# Patient Record
Sex: Female | Born: 2000
Health system: Southern US, Community
[De-identification: ages and names within clinical notes are randomized; demographics above are authoritative.]

## PROBLEM LIST (undated history)

## (undated) DIAGNOSIS — J45990 Exercise induced bronchospasm: Secondary | ICD-10-CM

## (undated) DIAGNOSIS — F909 Attention-deficit hyperactivity disorder, unspecified type: Secondary | ICD-10-CM

## (undated) DIAGNOSIS — T8859XA Other complications of anesthesia, initial encounter: Secondary | ICD-10-CM

## (undated) DIAGNOSIS — F419 Anxiety disorder, unspecified: Secondary | ICD-10-CM

## (undated) DIAGNOSIS — K219 Gastro-esophageal reflux disease without esophagitis: Secondary | ICD-10-CM

## (undated) HISTORY — DX: Attention-deficit hyperactivity disorder, unspecified type: F90.9

## (undated) HISTORY — PX: TONSILLECTOMY AND ADENOIDECTOMY: SHX28

## (undated) HISTORY — DX: Gastro-esophageal reflux disease without esophagitis: K21.9

## (undated) HISTORY — PX: FRACTURE SURGERY: SHX138

## (undated) HISTORY — PX: TYMPANOSTOMY: SHX2586

## (undated) HISTORY — DX: Exercise induced bronchospasm: J45.990

---
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
## 2019-02-09 DIAGNOSIS — Z6821 Body mass index (BMI) 21.0-21.9, adult: Secondary | ICD-10-CM | POA: Diagnosis not present

## 2019-02-09 DIAGNOSIS — Z01419 Encounter for gynecological examination (general) (routine) without abnormal findings: Secondary | ICD-10-CM | POA: Diagnosis not present

## 2019-02-09 DIAGNOSIS — Z113 Encounter for screening for infections with a predominantly sexual mode of transmission: Secondary | ICD-10-CM | POA: Diagnosis not present

## 2019-02-16 DIAGNOSIS — H5213 Myopia, bilateral: Secondary | ICD-10-CM | POA: Diagnosis not present

## 2019-02-18 DIAGNOSIS — Z03818 Encounter for observation for suspected exposure to other biological agents ruled out: Secondary | ICD-10-CM | POA: Diagnosis not present

## 2019-02-18 DIAGNOSIS — Z20828 Contact with and (suspected) exposure to other viral communicable diseases: Secondary | ICD-10-CM | POA: Diagnosis not present

## 2019-02-18 DIAGNOSIS — Z30430 Encounter for insertion of intrauterine contraceptive device: Secondary | ICD-10-CM | POA: Diagnosis not present

## 2019-02-18 DIAGNOSIS — Z3202 Encounter for pregnancy test, result negative: Secondary | ICD-10-CM | POA: Diagnosis not present

## 2019-02-19 ENCOUNTER — Ambulatory Visit: Payer: BLUE CROSS/BLUE SHIELD | Attending: Internal Medicine

## 2019-02-19 DIAGNOSIS — Z20822 Contact with and (suspected) exposure to covid-19: Secondary | ICD-10-CM | POA: Diagnosis not present

## 2019-02-20 LAB — NOVEL CORONAVIRUS, NAA: SARS-CoV-2, NAA: NOT DETECTED

## 2019-03-01 DIAGNOSIS — Z3202 Encounter for pregnancy test, result negative: Secondary | ICD-10-CM | POA: Diagnosis not present

## 2019-03-01 DIAGNOSIS — Z30431 Encounter for routine checking of intrauterine contraceptive device: Secondary | ICD-10-CM | POA: Diagnosis not present

## 2019-03-01 DIAGNOSIS — R102 Pelvic and perineal pain: Secondary | ICD-10-CM | POA: Diagnosis not present

## 2019-03-02 DIAGNOSIS — R102 Pelvic and perineal pain: Secondary | ICD-10-CM | POA: Diagnosis not present

## 2019-03-06 DIAGNOSIS — Z20828 Contact with and (suspected) exposure to other viral communicable diseases: Secondary | ICD-10-CM | POA: Diagnosis not present

## 2019-03-17 DIAGNOSIS — Z20828 Contact with and (suspected) exposure to other viral communicable diseases: Secondary | ICD-10-CM | POA: Diagnosis not present

## 2019-03-26 DIAGNOSIS — Z7189 Other specified counseling: Secondary | ICD-10-CM | POA: Diagnosis not present

## 2019-03-26 DIAGNOSIS — Z20828 Contact with and (suspected) exposure to other viral communicable diseases: Secondary | ICD-10-CM | POA: Diagnosis not present

## 2019-03-31 DIAGNOSIS — Z20828 Contact with and (suspected) exposure to other viral communicable diseases: Secondary | ICD-10-CM | POA: Diagnosis not present

## 2019-04-02 DIAGNOSIS — Z30431 Encounter for routine checking of intrauterine contraceptive device: Secondary | ICD-10-CM | POA: Diagnosis not present

## 2019-04-02 DIAGNOSIS — H5210 Myopia, unspecified eye: Secondary | ICD-10-CM | POA: Diagnosis not present

## 2019-04-14 DIAGNOSIS — Z20828 Contact with and (suspected) exposure to other viral communicable diseases: Secondary | ICD-10-CM | POA: Diagnosis not present

## 2019-04-21 DIAGNOSIS — Z20828 Contact with and (suspected) exposure to other viral communicable diseases: Secondary | ICD-10-CM | POA: Diagnosis not present

## 2019-04-22 DIAGNOSIS — Z23 Encounter for immunization: Secondary | ICD-10-CM | POA: Diagnosis not present

## 2019-04-24 DIAGNOSIS — Z20828 Contact with and (suspected) exposure to other viral communicable diseases: Secondary | ICD-10-CM | POA: Diagnosis not present

## 2019-04-28 DIAGNOSIS — Z20828 Contact with and (suspected) exposure to other viral communicable diseases: Secondary | ICD-10-CM | POA: Diagnosis not present

## 2019-07-22 DIAGNOSIS — S32009A Unspecified fracture of unspecified lumbar vertebra, initial encounter for closed fracture: Secondary | ICD-10-CM

## 2019-07-22 HISTORY — DX: Unspecified fracture of unspecified lumbar vertebra, initial encounter for closed fracture: S32.009A

## 2019-07-24 DIAGNOSIS — R52 Pain, unspecified: Secondary | ICD-10-CM | POA: Diagnosis not present

## 2019-07-24 DIAGNOSIS — S3993XA Unspecified injury of pelvis, initial encounter: Secondary | ICD-10-CM | POA: Diagnosis not present

## 2019-07-24 DIAGNOSIS — S6992XA Unspecified injury of left wrist, hand and finger(s), initial encounter: Secondary | ICD-10-CM | POA: Diagnosis not present

## 2019-07-24 DIAGNOSIS — R279 Unspecified lack of coordination: Secondary | ICD-10-CM | POA: Diagnosis not present

## 2019-07-24 DIAGNOSIS — Z743 Need for continuous supervision: Secondary | ICD-10-CM | POA: Diagnosis not present

## 2019-07-24 DIAGNOSIS — S52032C Displaced fracture of olecranon process with intraarticular extension of left ulna, initial encounter for open fracture type IIIA, IIIB, or IIIC: Secondary | ICD-10-CM | POA: Diagnosis not present

## 2019-07-24 DIAGNOSIS — Y9289 Other specified places as the place of occurrence of the external cause: Secondary | ICD-10-CM | POA: Diagnosis not present

## 2019-07-24 DIAGNOSIS — R58 Hemorrhage, not elsewhere classified: Secondary | ICD-10-CM | POA: Diagnosis not present

## 2019-07-24 DIAGNOSIS — S52022A Displaced fracture of olecranon process without intraarticular extension of left ulna, initial encounter for closed fracture: Secondary | ICD-10-CM | POA: Diagnosis not present

## 2019-07-24 DIAGNOSIS — S52032B Displaced fracture of olecranon process with intraarticular extension of left ulna, initial encounter for open fracture type I or II: Secondary | ICD-10-CM | POA: Diagnosis not present

## 2019-07-24 DIAGNOSIS — S32058A Other fracture of fifth lumbar vertebra, initial encounter for closed fracture: Secondary | ICD-10-CM | POA: Diagnosis not present

## 2019-07-24 DIAGNOSIS — S0993XA Unspecified injury of face, initial encounter: Secondary | ICD-10-CM | POA: Diagnosis not present

## 2019-07-24 DIAGNOSIS — Y998 Other external cause status: Secondary | ICD-10-CM | POA: Diagnosis not present

## 2019-07-24 DIAGNOSIS — S299XXA Unspecified injury of thorax, initial encounter: Secondary | ICD-10-CM | POA: Diagnosis not present

## 2019-07-24 DIAGNOSIS — S3991XA Unspecified injury of abdomen, initial encounter: Secondary | ICD-10-CM | POA: Diagnosis not present

## 2019-07-24 DIAGNOSIS — S3289XA Fracture of other parts of pelvis, initial encounter for closed fracture: Secondary | ICD-10-CM | POA: Diagnosis not present

## 2019-07-24 DIAGNOSIS — R5381 Other malaise: Secondary | ICD-10-CM | POA: Diagnosis not present

## 2019-07-24 DIAGNOSIS — M25522 Pain in left elbow: Secondary | ICD-10-CM | POA: Diagnosis not present

## 2019-07-24 DIAGNOSIS — S52002A Unspecified fracture of upper end of left ulna, initial encounter for closed fracture: Secondary | ICD-10-CM | POA: Diagnosis not present

## 2019-07-24 DIAGNOSIS — S32059A Unspecified fracture of fifth lumbar vertebra, initial encounter for closed fracture: Secondary | ICD-10-CM | POA: Diagnosis not present

## 2019-07-24 DIAGNOSIS — G8911 Acute pain due to trauma: Secondary | ICD-10-CM | POA: Diagnosis not present

## 2019-07-24 DIAGNOSIS — W091XXA Fall from playground swing, initial encounter: Secondary | ICD-10-CM | POA: Diagnosis not present

## 2019-07-24 DIAGNOSIS — S199XXA Unspecified injury of neck, initial encounter: Secondary | ICD-10-CM | POA: Diagnosis not present

## 2019-07-24 DIAGNOSIS — S52022B Displaced fracture of olecranon process without intraarticular extension of left ulna, initial encounter for open fracture type I or II: Secondary | ICD-10-CM | POA: Diagnosis not present

## 2019-07-24 DIAGNOSIS — S51002A Unspecified open wound of left elbow, initial encounter: Secondary | ICD-10-CM | POA: Diagnosis not present

## 2019-07-24 DIAGNOSIS — S4992XA Unspecified injury of left shoulder and upper arm, initial encounter: Secondary | ICD-10-CM | POA: Diagnosis not present

## 2019-07-24 DIAGNOSIS — S59912A Unspecified injury of left forearm, initial encounter: Secondary | ICD-10-CM | POA: Diagnosis not present

## 2019-07-24 DIAGNOSIS — S42402B Unspecified fracture of lower end of left humerus, initial encounter for open fracture: Secondary | ICD-10-CM | POA: Diagnosis not present

## 2019-07-24 DIAGNOSIS — W0110XA Fall on same level from slipping, tripping and stumbling with subsequent striking against unspecified object, initial encounter: Secondary | ICD-10-CM | POA: Diagnosis not present

## 2019-07-24 DIAGNOSIS — W19XXXA Unspecified fall, initial encounter: Secondary | ICD-10-CM | POA: Diagnosis not present

## 2019-07-24 DIAGNOSIS — Y9389 Activity, other specified: Secondary | ICD-10-CM | POA: Diagnosis not present

## 2019-07-24 DIAGNOSIS — D6869 Other thrombophilia: Secondary | ICD-10-CM | POA: Diagnosis not present

## 2019-07-24 DIAGNOSIS — S0990XA Unspecified injury of head, initial encounter: Secondary | ICD-10-CM | POA: Diagnosis not present

## 2019-07-24 DIAGNOSIS — S24109A Unspecified injury at unspecified level of thoracic spinal cord, initial encounter: Secondary | ICD-10-CM | POA: Diagnosis not present

## 2019-07-28 ENCOUNTER — Encounter: Payer: Self-pay | Admitting: Family Medicine

## 2019-08-03 DIAGNOSIS — M25522 Pain in left elbow: Secondary | ICD-10-CM | POA: Diagnosis not present

## 2019-08-03 DIAGNOSIS — M545 Low back pain: Secondary | ICD-10-CM | POA: Diagnosis not present

## 2019-08-05 DIAGNOSIS — M25522 Pain in left elbow: Secondary | ICD-10-CM | POA: Diagnosis not present

## 2019-08-31 DIAGNOSIS — M25522 Pain in left elbow: Secondary | ICD-10-CM | POA: Diagnosis not present

## 2019-09-01 DIAGNOSIS — S32058D Other fracture of fifth lumbar vertebra, subsequent encounter for fracture with routine healing: Secondary | ICD-10-CM | POA: Diagnosis not present

## 2019-09-03 DIAGNOSIS — M25522 Pain in left elbow: Secondary | ICD-10-CM | POA: Diagnosis not present

## 2019-09-06 DIAGNOSIS — M25522 Pain in left elbow: Secondary | ICD-10-CM | POA: Diagnosis not present

## 2019-09-06 DIAGNOSIS — L0390 Cellulitis, unspecified: Secondary | ICD-10-CM | POA: Diagnosis not present

## 2019-09-07 DIAGNOSIS — M25522 Pain in left elbow: Secondary | ICD-10-CM | POA: Diagnosis not present

## 2019-09-09 DIAGNOSIS — M25522 Pain in left elbow: Secondary | ICD-10-CM | POA: Diagnosis not present

## 2019-09-14 DIAGNOSIS — M25522 Pain in left elbow: Secondary | ICD-10-CM | POA: Diagnosis not present

## 2019-09-15 DIAGNOSIS — S52022D Displaced fracture of olecranon process without intraarticular extension of left ulna, subsequent encounter for closed fracture with routine healing: Secondary | ICD-10-CM | POA: Diagnosis not present

## 2019-09-15 DIAGNOSIS — M25522 Pain in left elbow: Secondary | ICD-10-CM | POA: Diagnosis not present

## 2019-09-15 DIAGNOSIS — M795 Residual foreign body in soft tissue: Secondary | ICD-10-CM | POA: Diagnosis not present

## 2019-09-21 DIAGNOSIS — S52022D Displaced fracture of olecranon process without intraarticular extension of left ulna, subsequent encounter for closed fracture with routine healing: Secondary | ICD-10-CM | POA: Diagnosis not present

## 2019-09-28 ENCOUNTER — Other Ambulatory Visit: Payer: Self-pay | Admitting: Orthopedic Surgery

## 2019-09-28 DIAGNOSIS — S52022D Displaced fracture of olecranon process without intraarticular extension of left ulna, subsequent encounter for closed fracture with routine healing: Secondary | ICD-10-CM

## 2019-09-28 DIAGNOSIS — M25522 Pain in left elbow: Secondary | ICD-10-CM | POA: Diagnosis not present

## 2019-09-28 DIAGNOSIS — Z4789 Encounter for other orthopedic aftercare: Secondary | ICD-10-CM | POA: Diagnosis not present

## 2019-09-29 ENCOUNTER — Ambulatory Visit
Admission: RE | Admit: 2019-09-29 | Discharge: 2019-09-29 | Disposition: A | Discharge status: Home / Self Care | Payer: BC Managed Care – PPO | Source: Ambulatory Visit | Attending: Orthopedic Surgery | Admitting: Orthopedic Surgery

## 2019-09-29 ENCOUNTER — Encounter: Payer: Self-pay | Admitting: Radiology

## 2019-09-29 DIAGNOSIS — S52022D Displaced fracture of olecranon process without intraarticular extension of left ulna, subsequent encounter for closed fracture with routine healing: Secondary | ICD-10-CM | POA: Diagnosis not present

## 2019-09-29 DIAGNOSIS — M25422 Effusion, left elbow: Secondary | ICD-10-CM | POA: Diagnosis not present

## 2019-09-30 ENCOUNTER — Other Ambulatory Visit (HOSPITAL_COMMUNITY)
Admission: RE | Admit: 2019-09-30 | Discharge: 2019-09-30 | Disposition: A | Discharge status: Home / Self Care | Payer: BC Managed Care – PPO | Source: Ambulatory Visit | Attending: Orthopedic Surgery | Admitting: Orthopedic Surgery

## 2019-09-30 ENCOUNTER — Encounter (HOSPITAL_COMMUNITY): Payer: Self-pay | Admitting: Orthopedic Surgery

## 2019-09-30 ENCOUNTER — Other Ambulatory Visit: Payer: Self-pay

## 2019-09-30 DIAGNOSIS — Z20822 Contact with and (suspected) exposure to covid-19: Secondary | ICD-10-CM | POA: Diagnosis not present

## 2019-09-30 DIAGNOSIS — Z01812 Encounter for preprocedural laboratory examination: Secondary | ICD-10-CM | POA: Diagnosis not present

## 2019-09-30 LAB — SARS CORONAVIRUS 2 (TAT 6-24 HRS): SARS Coronavirus 2: NEGATIVE

## 2019-09-30 NOTE — Progress Notes (Signed)
Spoke with pt for pre-op call. Pt denies cardiac history or diabetes.  Covid test done today, results pending. Pt states she has been in quarantine since the test was done and understands that she stays in quarantine until she comes for surgery on Saturday.

## 2019-09-30 NOTE — H&P (Signed)
Madison Bowen is an 19 y.o. female.   Chief Complaint: LEFT ELBOW PAIN  HPI: The patient is a 19y/o right hand dominant female who fell off a tire swing on 07/24/19 and landed on the left elbow. She underwent open reduction and internal fixation of the left elbow proximal ulna in [place] but due to living in [place], she came to our office for post-operative care.  She continued to have pain, swelling, and stiffness.  She was doing well with therapy working on range of motion but continued to have pain, swelling, and drainage of the elbow. On 09/15/19, she underwent left elbow foreign body removal and wound debridement. She did daily dressing changes and took Keflex. Due to the culture growing serratia and a CT scan done on 09/29/19 showing the healing fracture, we discussed removal of the hardware.  She is here today for surgery.  She denies chest pain, shortness of breath, fever, chills, nausea, vomiting, or diarrhea.    Past Medical History:  Diagnosis Date  . ADHD (attention deficit hyperactivity disorder)    Dr. 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 Closed fracture of transverse process of lumbar vertebra (HCC) 07/2019   L5 (related to fall/trauma)--[place]  . Exercise-induced bronchospasm 07/04/2013   (per old records)--pt/mother denies, just her BROTHER  . Reflux 10/2011   (old records; treated with prilosec)    Past Surgical History:  Procedure Laterality Date  . TONSILLECTOMY AND ADENOIDECTOMY  age 51  . TYMPANOSTOMY  age 25   ear tubes    Family History  Adopted: Yes  Problem Relation Age of Onset  . ADD / ADHD Brother   . Asthma Brother        exercise induced   Social History:  reports that she has never smoked. She has never used smokeless tobacco. She reports that she does not drink alcohol and does not use drugs.  Allergies: No Known Allergies  No medications prior to admission.     No results found for this or any previous visit (from the past 48 hour(s)). CT ELBOW LEFT WO CONTRAST  Result Date: 09/29/2019 CLINICAL DATA:  Olecranon fracture follow-up. EXAM: CT OF THE UPPER LEFT EXTREMITY WITHOUT CONTRAST TECHNIQUE: Multidetector CT imaging of the upper left extremity was performed according to the standard protocol. COMPARISON:  None. FINDINGS: Bones/Joint/Cartilage Healing comminuted fracture of the olecranon status post plate and screw fixation. Small portions of the fracture lines still remain visible. No evidence of hardware failure or loosening. No dislocation. Joint spaces are preserved. Small joint effusion. Ligaments Ligaments are suboptimally evaluated by CT. Muscles and Tendons Grossly intact.  No muscle atrophy. Soft tissue No fluid collection or hematoma.  No soft tissue mass. IMPRESSION: 1. Healing comminuted fracture of the olecranon status post ORIF. Small portions of the fracture lines still remain visible. No hardware complication. 2. Small joint effusion. Electronically Signed   By: Obie Dredge M.D.   On: 09/29/2019 11:26    ROS NO RECENT ILLNESSES OR HOSPITALIZATIONS  There were no vitals taken for this visit. Physical Exam  General Appearance:  Alert, cooperative, no distress, appears stated age  Head:  Normocephalic, without obvious abnormality, atraumatic  Eyes:  Pupils equal, conjunctiva/corneas clear,         Throat: Lips, mucosa, and tongue normal; teeth and gums normal  Neck: No visible masses     Lungs:   respirations unlabored  Chest Wall:  No tenderness  or deformity  Heart:  Regular rate and rhythm,  Abdomen:   Soft, non-tender,         Extremities: LUE: dressing intact ,fingers warm well perfused Able to extend thumb and fingers Fingers warm well perfused  Pulses: 2+ and symmetric  Skin: Skin color, texture, turgor normal, no rashes or lesions     Neurologic: Normal    Assessment/Plan LEFT PROXIMAL ULNA OPEN WOUND AND  RETAINED HARDWARE    - LEFT ELBOW DEEP IMPLANT REMOVAL AND BONE DEBRIDEMENT   WE ARE PLANNING SURGERY FOR YOUR UPPER EXTREMITY. THE RISKS AND BENEFITS OF SURGERY INCLUDE BUT NOT LIMITED TO BLEEDING INFECTION, DAMAGE TO NEARBY NERVES ARTERIES TENDONS, FAILURE OF SURGERY TO ACCOMPLISH ITS INTENDED GOALS, PERSISTENT SYMPTOMS AND NEED FOR FURTHER SURGICAL INTERVENTION. WITH THIS IN MIND WE WILL PROCEED. I HAVE DISCUSSED WITH THE PATIENT THE PRE AND POSTOPERATIVE REGIMEN AND THE DOS AND DON'TS. PT VOICED UNDERSTANDING AND INFORMED CONSENT SIGNED.  R/B/A DISCUSSED WITH PT IN OFFICE.  PT VOICED UNDERSTANDING OF PLAN CONSENT SIGNED DAY OF SURGERY PT SEEN AND EXAMINED PRIOR TO OPERATIVE PROCEDURE/DAY OF SURGERY SITE MARKED. QUESTIONS ANSWERED WILL GO HOME FOLLOWING SURGERY  Tailer Volkert [facility] MD 10/02/19  Karma Greaser 09/30/2019, 1:18 PM

## 2019-10-01 ENCOUNTER — Other Ambulatory Visit: Payer: BLUE CROSS/BLUE SHIELD

## 2019-10-01 NOTE — Progress Notes (Signed)
Pt's father picked up the Pre-surgery Ensure for pt. Instructed him to have pt drink between 8:15 AM and 8:30 AM Saturday. He voiced understanding.

## 2019-10-02 ENCOUNTER — Ambulatory Visit (HOSPITAL_COMMUNITY)
Admission: RE | Admit: 2019-10-02 | Discharge: 2019-10-02 | Disposition: A | Discharge status: Home / Self Care | Payer: BC Managed Care – PPO | Attending: Orthopedic Surgery | Admitting: Orthopedic Surgery

## 2019-10-02 ENCOUNTER — Ambulatory Visit (HOSPITAL_COMMUNITY): Payer: BC Managed Care – PPO

## 2019-10-02 ENCOUNTER — Other Ambulatory Visit: Payer: Self-pay

## 2019-10-02 ENCOUNTER — Encounter (HOSPITAL_COMMUNITY): Payer: Self-pay | Admitting: Orthopedic Surgery

## 2019-10-02 ENCOUNTER — Ambulatory Visit (HOSPITAL_COMMUNITY): Payer: BC Managed Care – PPO | Admitting: Certified Registered"

## 2019-10-02 ENCOUNTER — Encounter (HOSPITAL_COMMUNITY)
Admission: RE | Disposition: A | Discharge status: Home / Self Care | Payer: Self-pay | Source: Home / Self Care | Attending: Orthopedic Surgery

## 2019-10-02 DIAGNOSIS — T8484XA Pain due to internal orthopedic prosthetic devices, implants and grafts, initial encounter: Secondary | ICD-10-CM | POA: Diagnosis not present

## 2019-10-02 DIAGNOSIS — S52022D Displaced fracture of olecranon process without intraarticular extension of left ulna, subsequent encounter for closed fracture with routine healing: Secondary | ICD-10-CM | POA: Diagnosis not present

## 2019-10-02 DIAGNOSIS — Z4689 Encounter for fitting and adjustment of other specified devices: Secondary | ICD-10-CM | POA: Diagnosis not present

## 2019-10-02 DIAGNOSIS — F909 Attention-deficit hyperactivity disorder, unspecified type: Secondary | ICD-10-CM | POA: Diagnosis not present

## 2019-10-02 DIAGNOSIS — Y838 Other surgical procedures as the cause of abnormal reaction of the patient, or of later complication, without mention of misadventure at the time of the procedure: Secondary | ICD-10-CM | POA: Insufficient documentation

## 2019-10-02 DIAGNOSIS — S52002D Unspecified fracture of upper end of left ulna, subsequent encounter for closed fracture with routine healing: Secondary | ICD-10-CM | POA: Diagnosis not present

## 2019-10-02 DIAGNOSIS — S52032A Displaced fracture of olecranon process with intraarticular extension of left ulna, initial encounter for closed fracture: Secondary | ICD-10-CM

## 2019-10-02 HISTORY — PX: HARDWARE REMOVAL: SHX979

## 2019-10-02 HISTORY — DX: Other complications of anesthesia, initial encounter: T88.59XA

## 2019-10-02 HISTORY — DX: Anxiety disorder, unspecified: F41.9

## 2019-10-02 LAB — POCT PREGNANCY, URINE: Preg Test, Ur: NEGATIVE

## 2019-10-02 SURGERY — REMOVAL, HARDWARE
Anesthesia: Regional | Site: Elbow | Laterality: Left

## 2019-10-02 MED ORDER — PROPOFOL 10 MG/ML IV BOLUS
INTRAVENOUS | Status: AC
  Filled 2019-10-02: qty 20

## 2019-10-02 MED ORDER — ACETAMINOPHEN 160 MG/5ML PO SOLN: 325.0000 mg | Freq: Once | ORAL | Status: DC | PRN

## 2019-10-02 MED ORDER — FENTANYL CITRATE (PF) 100 MCG/2ML IJ SOLN
INTRAMUSCULAR | Status: AC
  Filled 2019-10-02: qty 2

## 2019-10-02 MED ORDER — CEFAZOLIN SODIUM-DEXTROSE 2-4 GM/100ML-% IV SOLN
INTRAVENOUS | Status: AC
  Filled 2019-10-02: qty 100

## 2019-10-02 MED ORDER — DEXAMETHASONE SODIUM PHOSPHATE 10 MG/ML IJ SOLN
INTRAMUSCULAR | Status: DC | PRN
  Administered 2019-10-02: 4 mg via INTRAVENOUS

## 2019-10-02 MED ORDER — MIDAZOLAM HCL 5 MG/5ML IJ SOLN
INTRAMUSCULAR | Status: DC | PRN
  Administered 2019-10-02 (×2): 1 mg via INTRAVENOUS

## 2019-10-02 MED ORDER — PROPOFOL 10 MG/ML IV BOLUS
INTRAVENOUS | Status: DC | PRN
  Administered 2019-10-02: 20 mg via INTRAVENOUS

## 2019-10-02 MED ORDER — LACTATED RINGERS IV SOLN: INTRAVENOUS | Status: DC

## 2019-10-02 MED ORDER — FENTANYL CITRATE (PF) 100 MCG/2ML IJ SOLN
INTRAMUSCULAR | Status: AC
  Administered 2019-10-02: 100 ug via INTRAVENOUS
  Filled 2019-10-02: qty 2

## 2019-10-02 MED ORDER — PROPOFOL 500 MG/50ML IV EMUL
INTRAVENOUS | Status: DC | PRN
  Administered 2019-10-02: 200 ug/kg/min via INTRAVENOUS

## 2019-10-02 MED ORDER — MEPERIDINE HCL 25 MG/ML IJ SOLN: 6.2500 mg | INTRAMUSCULAR | Status: DC | PRN

## 2019-10-02 MED ORDER — MIDAZOLAM HCL 2 MG/2ML IJ SOLN
INTRAMUSCULAR | Status: AC
  Administered 2019-10-02: 2 mg via INTRAVENOUS
  Filled 2019-10-02: qty 2

## 2019-10-02 MED ORDER — MIDAZOLAM HCL 2 MG/2ML IJ SOLN: 2.0000 mg | Freq: Once | INTRAMUSCULAR | Status: AC

## 2019-10-02 MED ORDER — MIDAZOLAM HCL 2 MG/2ML IJ SOLN
INTRAMUSCULAR | Status: AC
  Filled 2019-10-02: qty 2

## 2019-10-02 MED ORDER — AMISULPRIDE (ANTIEMETIC) 5 MG/2ML IV SOLN: 10.0000 mg | Freq: Once | INTRAVENOUS | Status: DC | PRN

## 2019-10-02 MED ORDER — OXYCODONE HCL 5 MG PO TABS: 5.0000 mg | ORAL_TABLET | Freq: Once | ORAL | Status: DC | PRN

## 2019-10-02 MED ORDER — 0.9 % SODIUM CHLORIDE (POUR BTL) OPTIME
TOPICAL | Status: DC | PRN
  Administered 2019-10-02: 2000 mL

## 2019-10-02 MED ORDER — FENTANYL CITRATE (PF) 100 MCG/2ML IJ SOLN: 100.0000 ug | Freq: Once | INTRAMUSCULAR | Status: AC

## 2019-10-02 MED ORDER — OXYCODONE HCL 5 MG/5ML PO SOLN: 5.0000 mg | Freq: Once | ORAL | Status: DC | PRN

## 2019-10-02 MED ORDER — ACETAMINOPHEN 10 MG/ML IV SOLN: 1000.0000 mg | Freq: Once | INTRAVENOUS | Status: DC | PRN

## 2019-10-02 MED ORDER — ACETAMINOPHEN 325 MG PO TABS: 325.0000 mg | ORAL_TABLET | Freq: Once | ORAL | Status: DC | PRN

## 2019-10-02 MED ORDER — CHLORHEXIDINE GLUCONATE 0.12 % MT SOLN: 15.0000 mL | Freq: Once | OROMUCOSAL | Status: AC

## 2019-10-02 MED ORDER — LIDOCAINE 2% (20 MG/ML) 5 ML SYRINGE
INTRAMUSCULAR | Status: DC | PRN
  Administered 2019-10-02: 20 mg via INTRAVENOUS

## 2019-10-02 MED ORDER — HYDROMORPHONE HCL 1 MG/ML IJ SOLN: 0.2500 mg | INTRAMUSCULAR | Status: DC | PRN

## 2019-10-02 MED ORDER — ORAL CARE MOUTH RINSE: 15.0000 mL | Freq: Once | OROMUCOSAL | Status: AC

## 2019-10-02 MED ORDER — CHLORHEXIDINE GLUCONATE 0.12 % MT SOLN
OROMUCOSAL | Status: AC
  Administered 2019-10-02: 15 mL via OROMUCOSAL
  Filled 2019-10-02: qty 15

## 2019-10-02 MED ORDER — BUPIVACAINE-EPINEPHRINE (PF) 0.5% -1:200000 IJ SOLN
INTRAMUSCULAR | Status: DC | PRN
  Administered 2019-10-02: 20 mL via PERINEURAL

## 2019-10-02 MED ORDER — ONDANSETRON HCL 4 MG/2ML IJ SOLN
INTRAMUSCULAR | Status: DC | PRN
  Administered 2019-10-02: 4 mg via INTRAVENOUS

## 2019-10-02 MED ORDER — CEFAZOLIN SODIUM-DEXTROSE 2-4 GM/100ML-% IV SOLN
2.0000 g | INTRAVENOUS | Status: AC
  Administered 2019-10-02: 2 g via INTRAVENOUS

## 2019-10-02 SURGICAL SUPPLY — 53 items
BLADE CLIPPER SURG (BLADE) IMPLANT
BNDG ELASTIC 3X5.8 VLCR STR LF (GAUZE/BANDAGES/DRESSINGS) ×2 IMPLANT
BNDG ELASTIC 4X5.8 VLCR STR LF (GAUZE/BANDAGES/DRESSINGS) IMPLANT
BNDG ESMARK 4X9 LF (GAUZE/BANDAGES/DRESSINGS) ×2 IMPLANT
BNDG GAUZE ELAST 4 BULKY (GAUZE/BANDAGES/DRESSINGS) ×2 IMPLANT
CORD BIPOLAR FORCEPS 12FT (ELECTRODE) ×2 IMPLANT
COVER SURGICAL LIGHT HANDLE (MISCELLANEOUS) ×2 IMPLANT
CUFF TOURN SGL QUICK 18X4 (TOURNIQUET CUFF) ×2 IMPLANT
DRAIN TLS ROUND 10FR (DRAIN) IMPLANT
DRAPE OEC MINIVIEW 54X84 (DRAPES) IMPLANT
DRAPE SURG 17X11 SM STRL (DRAPES) ×2 IMPLANT
DRSG ADAPTIC 3X8 NADH LF (GAUZE/BANDAGES/DRESSINGS) IMPLANT
DRSG TELFA 3X8 NADH (GAUZE/BANDAGES/DRESSINGS) ×2 IMPLANT
ELECT REM PT RETURN 9FT ADLT (ELECTROSURGICAL)
ELECTRODE REM PT RTRN 9FT ADLT (ELECTROSURGICAL) IMPLANT
GAUZE 4X4 16PLY RFD (DISPOSABLE) ×2 IMPLANT
GAUZE SPONGE 4X4 12PLY STRL (GAUZE/BANDAGES/DRESSINGS) ×2 IMPLANT
GAUZE XEROFORM 5X9 LF (GAUZE/BANDAGES/DRESSINGS) ×2 IMPLANT
GLOVE BIOGEL PI IND STRL 8.5 (GLOVE) ×2 IMPLANT
GLOVE BIOGEL PI INDICATOR 8.5 (GLOVE) ×2
GLOVE SURG ORTHO 8.0 STRL STRW (GLOVE) ×6 IMPLANT
GOWN STRL REUS W/ TWL LRG LVL3 (GOWN DISPOSABLE) ×1 IMPLANT
GOWN STRL REUS W/ TWL XL LVL3 (GOWN DISPOSABLE) ×2 IMPLANT
GOWN STRL REUS W/TWL LRG LVL3 (GOWN DISPOSABLE) ×1
GOWN STRL REUS W/TWL XL LVL3 (GOWN DISPOSABLE) ×2
KIT BASIN OR (CUSTOM PROCEDURE TRAY) ×2 IMPLANT
KIT TURNOVER KIT B (KITS) ×2 IMPLANT
MANIFOLD NEPTUNE II (INSTRUMENTS) ×2 IMPLANT
NEEDLE 22X1 1/2 (OR ONLY) (NEEDLE) IMPLANT
NS IRRIG 1000ML POUR BTL (IV SOLUTION) ×4 IMPLANT
PACK ORTHO EXTREMITY (CUSTOM PROCEDURE TRAY) ×2 IMPLANT
PAD ARMBOARD 7.5X6 YLW CONV (MISCELLANEOUS) ×4 IMPLANT
PAD CAST 4YDX4 CTTN HI CHSV (CAST SUPPLIES) ×1 IMPLANT
PADDING CAST COTTON 4X4 STRL (CAST SUPPLIES) ×1
SOAP 2 % CHG 4 OZ (WOUND CARE) ×2 IMPLANT
SPLINT FIBERGLASS 3X35 (CAST SUPPLIES) ×2 IMPLANT
SPONGE LAP 4X18 RFD (DISPOSABLE) ×2 IMPLANT
STRIP CLOSURE SKIN 1/2X4 (GAUZE/BANDAGES/DRESSINGS) IMPLANT
SUCTION FRAZIER HANDLE 10FR (MISCELLANEOUS) ×1
SUCTION TUBE FRAZIER 10FR DISP (MISCELLANEOUS) ×1 IMPLANT
SUT ETHILON 4 0 PS 2 18 (SUTURE) IMPLANT
SUT MNCRL AB 4-0 PS2 18 (SUTURE) IMPLANT
SUT MNCRL+ AB 3-0 CT1 36 (SUTURE) ×1 IMPLANT
SUT MONOCRYL AB 3-0 CT1 36IN (SUTURE) ×1
SUT PROLENE 3 0 PS 1 (SUTURE) ×6 IMPLANT
SUT VIC AB 3-0 FS2 27 (SUTURE) IMPLANT
SYR CONTROL 10ML LL (SYRINGE) IMPLANT
SYSTEM CHEST DRAIN TLS 7FR (DRAIN) IMPLANT
TOWEL GREEN STERILE (TOWEL DISPOSABLE) ×2 IMPLANT
TOWEL GREEN STERILE FF (TOWEL DISPOSABLE) ×2 IMPLANT
TUBE CONNECTING 12X1/4 (SUCTIONS) ×2 IMPLANT
WATER STERILE IRR 1000ML POUR (IV SOLUTION) ×2 IMPLANT
YANKAUER SUCT BULB TIP NO VENT (SUCTIONS) IMPLANT

## 2019-10-02 NOTE — Anesthesia Procedure Notes (Signed)
Procedure Name: MAC Date/Time: 10/02/2019 12:07 PM Performed by: Wilburn Cornelia, CRNA Pre-anesthesia Checklist: Patient identified, Emergency Drugs available, Suction available, Patient being monitored and Timeout performed Patient Re-evaluated:Patient Re-evaluated prior to induction Oxygen Delivery Method: Simple face mask Placement Confirmation: positive ETCO2 and breath sounds checked- equal and bilateral Dental Injury: Teeth and Oropharynx as per pre-operative assessment

## 2019-10-02 NOTE — OR Nursing (Signed)
HARDWARE ACTUALLY CLEANED AND SCRUBBED  BY SCRUB TECH IN HYDROGEN PEROXIDE BATH; PLACED IN BIOHAZARD BAG AND TAKEN TO PACU NURSE FOR RETURN TO PATIENT ONCE AWAKE. HARDWARE NEVER SENT TO CENTRAL PROCESSING AFTER ALL.

## 2019-10-02 NOTE — Anesthesia Procedure Notes (Signed)
Anesthesia Regional Block: Supraclavicular block   Pre-Anesthetic Checklist: ,, timeout performed, Correct Patient, Correct Site, Correct Laterality, Correct Procedure, Correct Position, site marked, Risks and benefits discussed,  Surgical consent,  Pre-op evaluation,  At surgeon's request and post-op pain management  Laterality: Left  Prep: chloraprep       Needles:  Injection technique: Single-shot  Needle Type: Echogenic Stimulator Needle     Needle Length: 9cm  Needle Gauge: 21     Additional Needles:   Procedures:,,,, ultrasound used (permanent image in chart),,,,  Narrative:  Start time: 10/02/2019 11:10 AM End time: 10/02/2019 11:15 AM Injection made incrementally with aspirations every 5 mL.  Performed by: Personally  Anesthesiologist: Shelton Silvas, MD  Additional Notes: Patient tolerated the procedure well. Local anesthetic introduced in an incremental fashion under minimal resistance after negative aspirations. No paresthesias were elicited. After completion of the procedure, no acute issues were identified and patient continued to be monitored by RN.

## 2019-10-02 NOTE — OR Nursing (Signed)
Surgeon rermoved one plate and 11 screws from left elbow. Hardware sent to central processing.

## 2019-10-02 NOTE — Transfer of Care (Signed)
Immediate Anesthesia Transfer of Care Note  Patient: Madison Bowen  Procedure(s) Performed: Left elbow deep implant removal and bone debridement (Left Elbow)  Patient Location: PACU  Anesthesia Type:MAC and Regional  Level of Consciousness: awake and alert   Airway & Oxygen Therapy: Patient Spontanous Breathing and Patient connected to face mask oxygen  Post-op Assessment: Report given to RN and Post -op Vital signs reviewed and stable  Post vital signs: Reviewed and stable  Last Vitals:  Vitals Value Taken Time  BP 96/68 10/02/19 1328  Temp    Pulse    Resp 12 10/02/19 1328  SpO2    Vitals shown include unvalidated device data.  Last Pain:  Vitals:   10/02/19 0953  PainSc: 4       Patients Stated Pain Goal: 6 (59/13/68 5992)  Complications: No complications documented.

## 2019-10-02 NOTE — Op Note (Signed)
PREOPERATIVE DIAGNOSIS: Left elbow retained hardware with possible osteomyelitis proximal ulna  POSTOPERATIVE DIAGNOSIS: Same  ATTENDING SURGEON: Dr. Bradly Bienenstock who scrubbed and present for the entire procedure  ASSISTANT SURGEON: None  ANESTHESIA: Regional with IV sedation  OPERATIVE PROCEDURE: Left elbow deep implant removal plates and screws Left ulna bone curettage and debridement proximal ulna possible osteomyelitis Radiographs 3 views left elbow  IMPLANTS: None  RADIOGRAPHIC INTERPRETATION: AP lateral oblique views of the elbow do show the removal of the implant with good alignment of the proximal ulna and joint alignment  SURGICAL INDICATIONS: Patient is a right-hand-dominant female had a persistent draining wound after she sustained a proximal olecranon fracture months ago had a surgically debrided and fixed in [place].  Recommended the patient undergo the above procedure.  Risks of surgery include but not limited to bleeding infection damage nearby nerves arteries or tendons loss of motion of the wrist and digits incomplete relief of symptoms and need for further surgical intervention.  SURGICAL TECHNIQUE: Patient was palpated find the preoperative holding area marked for marker made the left elbow and indicate correct operative site.  Patient brought back operating placed supine on the anesthesia table where the regional anesthetic was administered.  Patient tolerates well.  Well-padded tourniquet placed on the left brachium seal with appropriate drape left upper extremity then prepped and draped normal sterile fashion.  A timeout was called the correct site was then fine procedure then begun.  The previous incision was then extended proximally distally after the tourniquet insufflated.  Dissection carried down to the skin subcutaneous tissue to identify the exposed plate and screw.  After identification of all the screws screwdrivers were then reused in the screws were removed  proximally and distally.  The plate was able to be removed.  The patient also had 2 separate screws that were outside of the plate there also identified and removed without any complicating features.  After deep implant removal copious irrigation was then done.  The patient did have the biofilm type substance over the ulna.  Debridement was then carried out over the lamina and bone cultures were then taken.  Appear to be a small nidus or concerning area of infection at the proximal extent of the ulna.  Bone curettage was then carried out of the screw holes.  Thorough wound irrigation was done.  None of the screws were loose.  Did not appear to be gross contamination or looseness of the implant.  After curettage stress radiography was then carried out under live fluoroscopy which showed good alignment and position of the proximal olecranon.  The wound was then irrigated.  The fascia layer was then closed over the bone with Monocryl monofilament suture.  Following this the skin was then closed using Prolene suture.  Xeroform dressing sterile compressive bandage then applied.  The patient tolerated the procedure well was taken recovery room in good condition after being placed in a long-arm splint.  POSTOPERATIVE PLAN: Patient be discharged to home.  See him back in the office in 6 days for wound check.  Repeat radiographs at the next visit.  We will place her back in the splint begin working on some gentle range of motion as the wound heals.  We will see how the wound heals and continue on the oral antibiotics and potentially consult infectious disease.

## 2019-10-02 NOTE — Anesthesia Postprocedure Evaluation (Signed)
Anesthesia Post Note  Patient: Madison Bowen  Procedure(s) Performed: Left elbow deep implant removal and bone debridement (Left Elbow)     Patient location during evaluation: PACU Anesthesia Type: Regional Level of consciousness: awake and alert Pain management: pain level controlled Vital Signs Assessment: post-procedure vital signs reviewed and stable Respiratory status: spontaneous breathing, nonlabored ventilation, respiratory function stable and patient connected to nasal cannula oxygen Cardiovascular status: stable and blood pressure returned to baseline Postop Assessment: no apparent nausea or vomiting Anesthetic complications: no   No complications documented.  Last Vitals:  Vitals:   10/02/19 1345 10/02/19 1400  BP: 99/78 102/72  Pulse: 89 77  Resp: 18 18  Temp:  36.8 C  SpO2: 98% 100%    Last Pain:  Vitals:   10/02/19 1400  PainSc: 0-No pain                 Shelton Silvas

## 2019-10-02 NOTE — Discharge Instructions (Signed)
KEEP BANDAGE CLEAN AND DRY CALL OFFICE FOR F/U APPT [phone] in 6 days KEEP HAND ELEVATED ABOVE HEART OK TO APPLY ICE TO OPERATIVE AREA CONTACT OFFICE IF ANY WORSENING PAIN OR CONCERNS.  

## 2019-10-02 NOTE — Anesthesia Preprocedure Evaluation (Signed)
Anesthesia Evaluation  Patient identified by MRN, date of birth, ID band Patient awake    Reviewed: Allergy & Precautions, NPO status , Patient's Chart, lab work & pertinent test results  Airway Mallampati: I  TM Distance: >3 FB Neck ROM: Full    Dental no notable dental hx.    Pulmonary    Pulmonary exam normal        Cardiovascular negative cardio ROS Normal cardiovascular exam     Neuro/Psych PSYCHIATRIC DISORDERS Anxiety  Neuromuscular disease    GI/Hepatic negative GI ROS, Neg liver ROS,   Endo/Other  negative endocrine ROS  Renal/GU negative Renal ROS     Musculoskeletal negative musculoskeletal ROS (+)   Abdominal Normal abdominal exam  (+)   Peds  Hematology negative hematology ROS (+)   Anesthesia Other Findings   Reproductive/Obstetrics                            Anesthesia Physical Anesthesia Plan  ASA: II  Anesthesia Plan: Regional   Post-op Pain Management:    Induction: Intravenous  PONV Risk Score and Plan: 3 and Ondansetron, Dexamethasone, Midazolam and Propofol infusion  Airway Management Planned: Natural Airway and Simple Face Mask  Additional Equipment: None  Intra-op Plan:   Post-operative Plan:   Informed Consent: I have reviewed the patients History and Physical, chart, labs and discussed the procedure including the risks, benefits and alternatives for the proposed anesthesia with the patient or authorized representative who has indicated his/her understanding and acceptance.       Plan Discussed with: CRNA  Anesthesia Plan Comments:        Anesthesia Quick Evaluation

## 2019-10-03 ENCOUNTER — Encounter (HOSPITAL_COMMUNITY): Payer: Self-pay | Admitting: Orthopedic Surgery

## 2019-10-07 LAB — AEROBIC/ANAEROBIC CULTURE W GRAM STAIN (SURGICAL/DEEP WOUND): Gram Stain: NONE SEEN

## 2019-10-12 DIAGNOSIS — Z03818 Encounter for observation for suspected exposure to other biological agents ruled out: Secondary | ICD-10-CM | POA: Diagnosis not present

## 2019-10-15 DIAGNOSIS — S52025D Nondisplaced fracture of olecranon process without intraarticular extension of left ulna, subsequent encounter for closed fracture with routine healing: Secondary | ICD-10-CM | POA: Diagnosis not present

## 2019-11-05 DIAGNOSIS — S52025D Nondisplaced fracture of olecranon process without intraarticular extension of left ulna, subsequent encounter for closed fracture with routine healing: Secondary | ICD-10-CM | POA: Diagnosis not present

## 2019-11-09 DIAGNOSIS — Z20822 Contact with and (suspected) exposure to covid-19: Secondary | ICD-10-CM | POA: Diagnosis not present

## 2019-11-18 DIAGNOSIS — M25622 Stiffness of left elbow, not elsewhere classified: Secondary | ICD-10-CM | POA: Diagnosis not present

## 2019-11-23 DIAGNOSIS — M25622 Stiffness of left elbow, not elsewhere classified: Secondary | ICD-10-CM | POA: Diagnosis not present

## 2019-11-23 DIAGNOSIS — Z03818 Encounter for observation for suspected exposure to other biological agents ruled out: Secondary | ICD-10-CM | POA: Diagnosis not present

## 2019-11-25 DIAGNOSIS — S52025D Nondisplaced fracture of olecranon process without intraarticular extension of left ulna, subsequent encounter for closed fracture with routine healing: Secondary | ICD-10-CM | POA: Diagnosis not present

## 2019-11-30 DIAGNOSIS — M25622 Stiffness of left elbow, not elsewhere classified: Secondary | ICD-10-CM | POA: Diagnosis not present

## 2019-12-13 DIAGNOSIS — M25622 Stiffness of left elbow, not elsewhere classified: Secondary | ICD-10-CM | POA: Diagnosis not present

## 2019-12-28 DIAGNOSIS — Z20822 Contact with and (suspected) exposure to covid-19: Secondary | ICD-10-CM | POA: Diagnosis not present

## 2020-01-03 DIAGNOSIS — R300 Dysuria: Secondary | ICD-10-CM | POA: Diagnosis not present

## 2020-01-03 DIAGNOSIS — N3001 Acute cystitis with hematuria: Secondary | ICD-10-CM | POA: Diagnosis not present

## 2020-01-07 DIAGNOSIS — R309 Painful micturition, unspecified: Secondary | ICD-10-CM | POA: Diagnosis not present

## 2020-01-07 DIAGNOSIS — Z30431 Encounter for routine checking of intrauterine contraceptive device: Secondary | ICD-10-CM | POA: Diagnosis not present

## 2020-01-26 ENCOUNTER — Encounter: Payer: Self-pay | Admitting: Family Medicine

## 2020-01-26 ENCOUNTER — Telehealth: Payer: BC Managed Care – PPO | Admitting: Family Medicine

## 2020-01-26 VITALS — Temp 98.6°F | Ht 65.0 in | Wt 125.0 lb

## 2020-01-26 DIAGNOSIS — J3489 Other specified disorders of nose and nasal sinuses: Secondary | ICD-10-CM

## 2020-01-26 DIAGNOSIS — R0981 Nasal congestion: Secondary | ICD-10-CM | POA: Diagnosis not present

## 2020-01-26 DIAGNOSIS — U071 COVID-19: Secondary | ICD-10-CM

## 2020-01-26 DIAGNOSIS — J029 Acute pharyngitis, unspecified: Secondary | ICD-10-CM

## 2020-01-26 NOTE — Progress Notes (Signed)
   Subjective:  Documentation for virtual audio and video telecommunications through Caregility encounter:  The patient was located at home. 2 patient identifiers used.  The provider was located in the office. The patient did consent to this visit and is aware of possible charges through their insurance for this visit.  The other persons participating in this telemedicine service were mother.  Time spent on call was 13 minutes and in review of previous records >15 minutes total.  This virtual service is not related to other E/M service within previous 7 days.   Patient ID: Madison Bowen, female    DOB: 2000/10/25, 19 y.o.   MRN: [id number]  HPI Chief Complaint  Patient presents with  . Sore Throat    VIRTUAL ST, fatigue, runny nose and fatigue-symptoms started yesterday. Did 3 home tests today (2 different brands) and all were positive.    Complains of a 2 day history of chills, rhinorrhea, nasal congestion, sore throat, post nasal drainage, dry cough.   States she took 3 different at home Covid tests and they were all positive. She showed me the tests on the screen.  She is quarantined currently at her parents but will be going back to her residence with her classmates.   Taking multi-symptom cold medication. States she is feeling some better already.   Denies fever, headache, sinus pressure, ear pain, chest pain, shortness of breath, abdominal pain, N/V/D.   Denies history of asthma or lung disease. She is immunocompetent.    She is a Consulting civil engineer at [employer]. She is supposed to start back to class next Monday.   She had the J and J vaccine in April 2021 but no booster.    Review of Systems Pertinent positives and negatives in the history of present illness.     Objective:   Physical Exam Temp 98.6 F (37 C) (Temporal)   Ht 5\' 5"  (1.651 m)   Wt 125 lb (56.7 kg)   BMI 20.80 kg/m   Alert and oriented and in no acute distress. Respirations unlabored. Speaking in  complete sentences without difficulty.       Assessment & Plan:  COVID-19 virus infection  Nasal congestion with rhinorrhea  Acute pharyngitis, unspecified etiology  Discussed that January 24, 2019 is day 0 and that her she can come out of quarantine only if she is basically symptom free on day 5 which is January 29, 2019.  She would then still need to wear her mask everywhere and when around others. If she is not symptom free on day 5 then she should isolate the full 10 days.  Discussed symptomatic treatment.  Follow up if worsening or any new concerns arise.

## 2020-01-31 DIAGNOSIS — Z20822 Contact with and (suspected) exposure to covid-19: Secondary | ICD-10-CM | POA: Diagnosis not present

## 2020-02-11 DIAGNOSIS — H52223 Regular astigmatism, bilateral: Secondary | ICD-10-CM | POA: Diagnosis not present

## 2020-02-11 DIAGNOSIS — H5213 Myopia, bilateral: Secondary | ICD-10-CM | POA: Diagnosis not present

## 2020-02-18 DIAGNOSIS — H52223 Regular astigmatism, bilateral: Secondary | ICD-10-CM | POA: Diagnosis not present

## 2020-03-16 DIAGNOSIS — R300 Dysuria: Secondary | ICD-10-CM | POA: Diagnosis not present

## 2020-03-16 DIAGNOSIS — Z3202 Encounter for pregnancy test, result negative: Secondary | ICD-10-CM | POA: Diagnosis not present

## 2020-04-18 DIAGNOSIS — R319 Hematuria, unspecified: Secondary | ICD-10-CM | POA: Diagnosis not present

## 2020-04-18 DIAGNOSIS — N3941 Urge incontinence: Secondary | ICD-10-CM | POA: Diagnosis not present

## 2020-04-18 DIAGNOSIS — R8290 Unspecified abnormal findings in urine: Secondary | ICD-10-CM | POA: Diagnosis not present

## 2020-04-18 DIAGNOSIS — R109 Unspecified abdominal pain: Secondary | ICD-10-CM | POA: Diagnosis not present

## 2020-04-18 DIAGNOSIS — R350 Frequency of micturition: Secondary | ICD-10-CM | POA: Diagnosis not present

## 2020-04-19 DIAGNOSIS — N3001 Acute cystitis with hematuria: Secondary | ICD-10-CM | POA: Diagnosis not present

## 2020-04-19 DIAGNOSIS — N390 Urinary tract infection, site not specified: Secondary | ICD-10-CM | POA: Diagnosis not present

## 2020-05-03 DIAGNOSIS — N390 Urinary tract infection, site not specified: Secondary | ICD-10-CM | POA: Diagnosis not present

## 2020-05-03 DIAGNOSIS — N3001 Acute cystitis with hematuria: Secondary | ICD-10-CM | POA: Diagnosis not present

## 2020-05-03 DIAGNOSIS — R319 Hematuria, unspecified: Secondary | ICD-10-CM | POA: Diagnosis not present

## 2020-05-31 DIAGNOSIS — N390 Urinary tract infection, site not specified: Secondary | ICD-10-CM | POA: Diagnosis not present

## 2020-05-31 DIAGNOSIS — R309 Painful micturition, unspecified: Secondary | ICD-10-CM | POA: Diagnosis not present

## 2020-07-29 DIAGNOSIS — K649 Unspecified hemorrhoids: Secondary | ICD-10-CM | POA: Diagnosis not present

## 2020-07-29 DIAGNOSIS — K602 Anal fissure, unspecified: Secondary | ICD-10-CM | POA: Diagnosis not present

## 2020-08-17 DIAGNOSIS — R8271 Bacteriuria: Secondary | ICD-10-CM | POA: Diagnosis not present

## 2020-08-17 DIAGNOSIS — R351 Nocturia: Secondary | ICD-10-CM | POA: Diagnosis not present

## 2020-08-17 DIAGNOSIS — R350 Frequency of micturition: Secondary | ICD-10-CM | POA: Diagnosis not present

## 2020-09-28 DIAGNOSIS — N281 Cyst of kidney, acquired: Secondary | ICD-10-CM | POA: Diagnosis not present

## 2020-09-28 DIAGNOSIS — R351 Nocturia: Secondary | ICD-10-CM | POA: Diagnosis not present

## 2020-09-28 DIAGNOSIS — R350 Frequency of micturition: Secondary | ICD-10-CM | POA: Diagnosis not present

## 2020-09-28 DIAGNOSIS — N3020 Other chronic cystitis without hematuria: Secondary | ICD-10-CM | POA: Diagnosis not present

## 2020-10-05 DIAGNOSIS — N281 Cyst of kidney, acquired: Secondary | ICD-10-CM | POA: Diagnosis not present

## 2020-10-27 DIAGNOSIS — R350 Frequency of micturition: Secondary | ICD-10-CM | POA: Diagnosis not present

## 2020-10-27 DIAGNOSIS — N3020 Other chronic cystitis without hematuria: Secondary | ICD-10-CM | POA: Diagnosis not present

## 2020-12-26 DIAGNOSIS — Z113 Encounter for screening for infections with a predominantly sexual mode of transmission: Secondary | ICD-10-CM | POA: Diagnosis not present

## 2020-12-26 DIAGNOSIS — Z6820 Body mass index (BMI) 20.0-20.9, adult: Secondary | ICD-10-CM | POA: Diagnosis not present

## 2020-12-26 DIAGNOSIS — Z01419 Encounter for gynecological examination (general) (routine) without abnormal findings: Secondary | ICD-10-CM | POA: Diagnosis not present

## 2021-02-06 DIAGNOSIS — Z87440 Personal history of urinary (tract) infections: Secondary | ICD-10-CM | POA: Diagnosis not present

## 2021-02-06 DIAGNOSIS — R82998 Other abnormal findings in urine: Secondary | ICD-10-CM | POA: Diagnosis not present

## 2021-02-06 DIAGNOSIS — R3129 Other microscopic hematuria: Secondary | ICD-10-CM | POA: Diagnosis not present

## 2021-02-06 DIAGNOSIS — N390 Urinary tract infection, site not specified: Secondary | ICD-10-CM | POA: Diagnosis not present

## 2021-04-24 DIAGNOSIS — N390 Urinary tract infection, site not specified: Secondary | ICD-10-CM | POA: Diagnosis not present

## 2021-05-18 ENCOUNTER — Telehealth: Payer: Self-pay | Admitting: Family Medicine

## 2021-05-18 DIAGNOSIS — Z23 Encounter for immunization: Secondary | ICD-10-CM | POA: Diagnosis not present

## 2021-05-18 NOTE — Telephone Encounter (Signed)
Pt called and wants to know if she can have a tetnus shot states she Is pass due, ?Informed pt it would be Monday before I got back to her about  ?

## 2021-06-16 IMAGING — CT CT ELBOW*L* W/O CM
4 of 6 series · 14 of 36 positions shown, 16 images · non-contrast
Comparison: None.

CLINICAL DATA: Olecranon fracture follow-up.

EXAM:
CT OF THE UPPER LEFT EXTREMITY WITHOUT CONTRAST
TECHNIQUE: Multidetector CT imaging of the upper left extremity was performed
according to the standard protocol.

[Series 3: elbow 1.50 br60 s3 axial bone hd fov · axial · 0.34mm/px · z∈[-771,-640]mm · 5 of 248 slices shown, 7 images]
[im 42/248  soft-tissue]
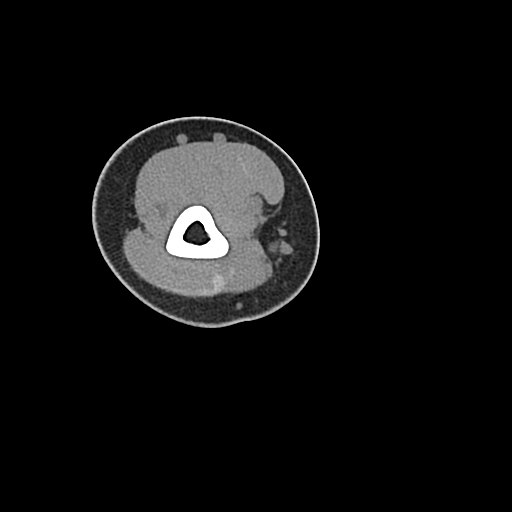
[im 42/248  bone]
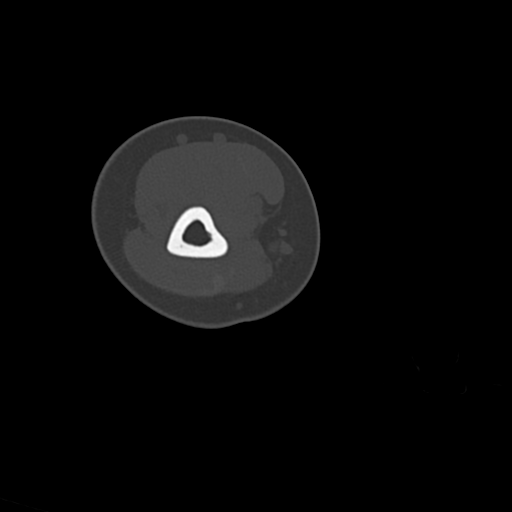
[im 83/248  bone]
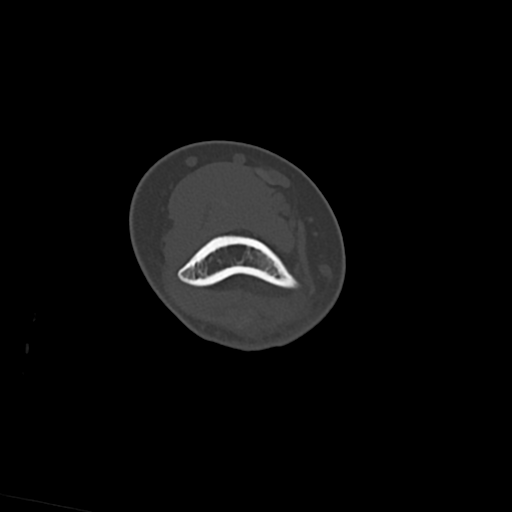
[im 124/248  bone]
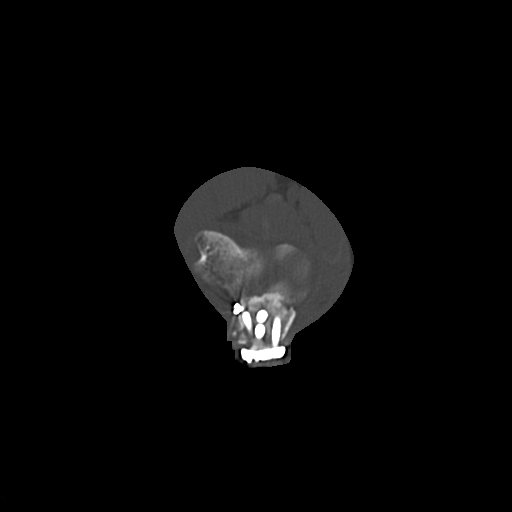
[im 165/248  bone]
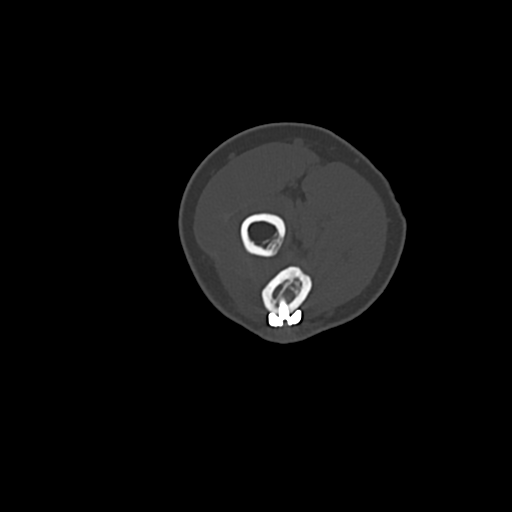
[im 206/248  soft-tissue]
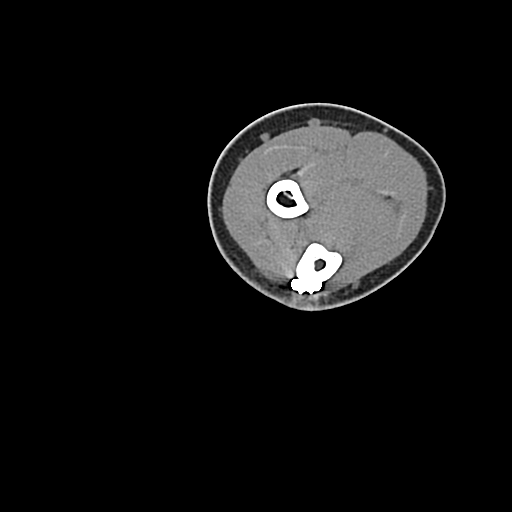
[im 206/248  bone]
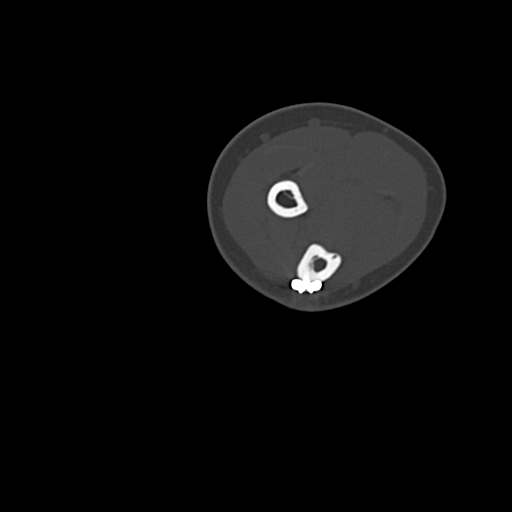

[Series 5: elbow 1.50 br40 s3 axial st hd fov · axial · 0.34mm/px · z∈[-771,-738]mm · 2 of 248 slices shown]
[im 42/248  bone]
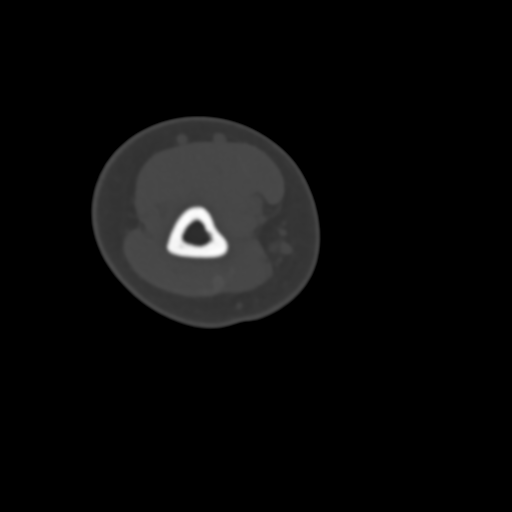
[im 83/248  bone]
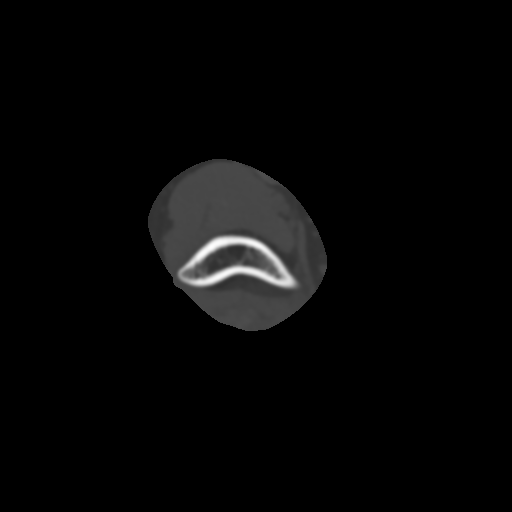

[Series 9: elbow 1.50 hr60 s3 cor bone hd fov · coronal · 0.24mm/px · 1 of 139 slices shown]
[im 70/139  bone]
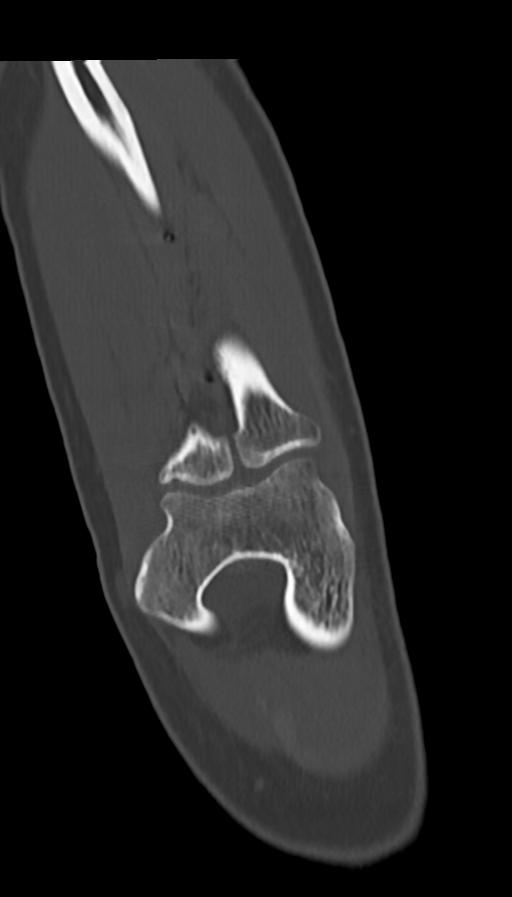

[Series 13: elbow 1.50 br60 s3 sag bone hd fov · sagittal · 0.29mm/px · 6 of 150 slices shown]
[im 25/150  bone]
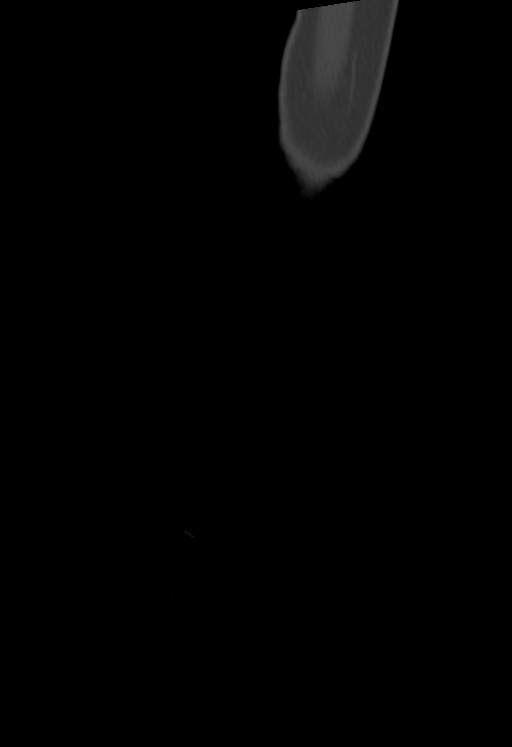
[im 40/150  soft-tissue]
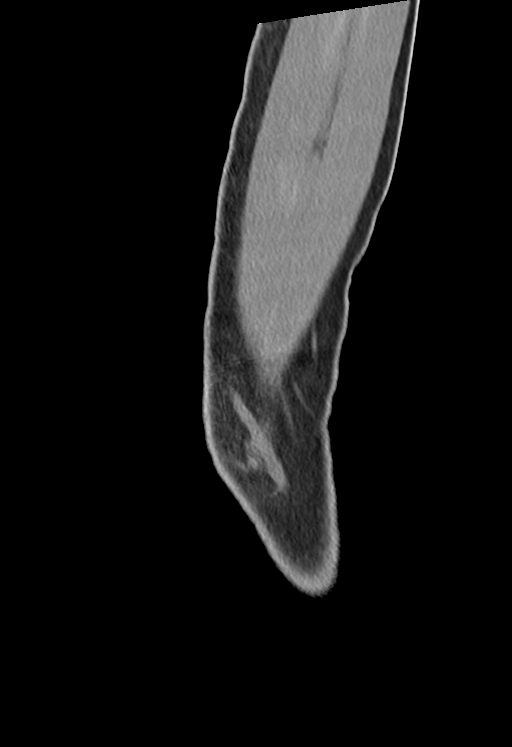
[im 50/150  bone]
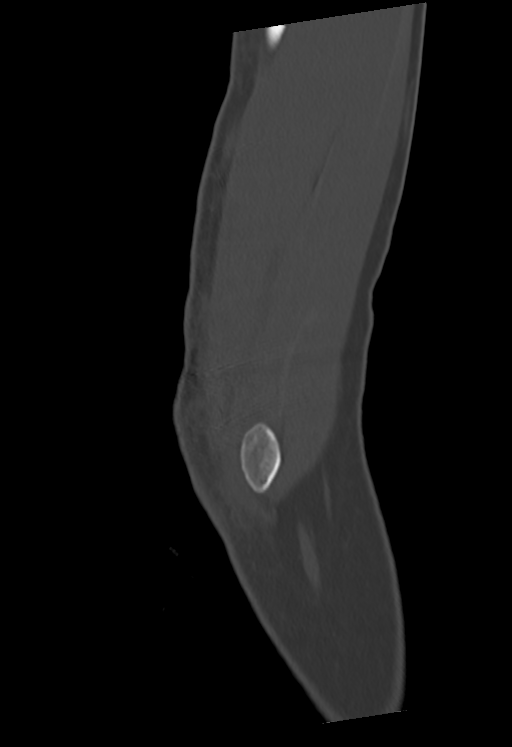
[im 75/150  bone]
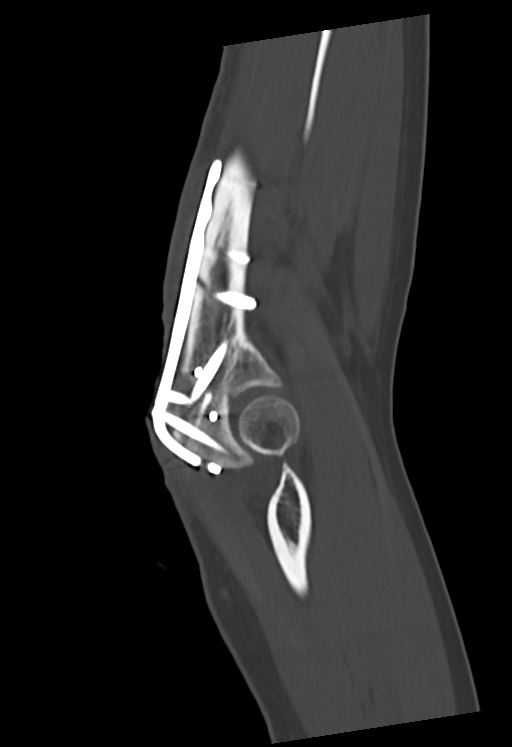
[im 100/150  bone]
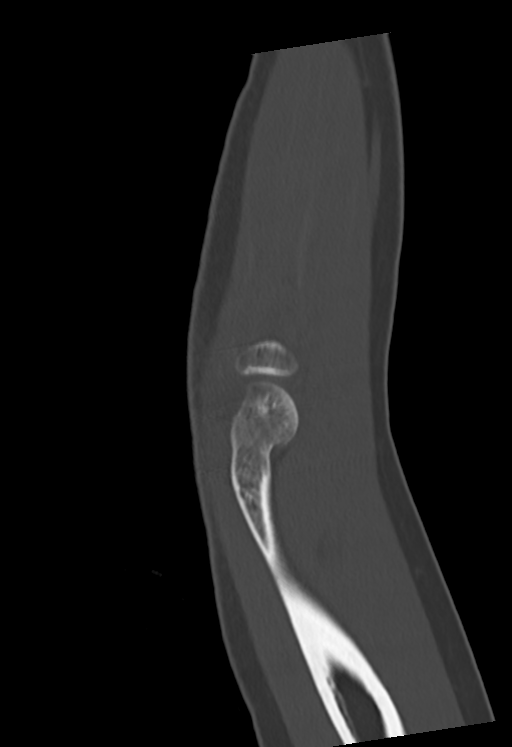
[im 125/150  bone]
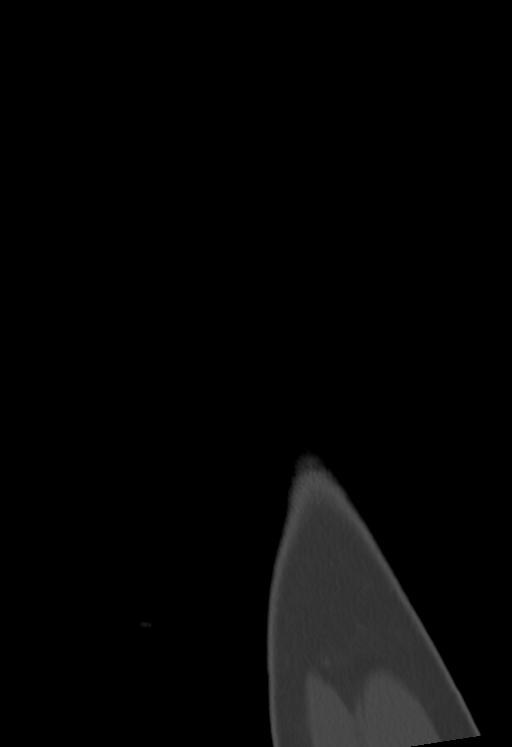

[14 of 36 positions shown; findings below may reference images not displayed]

FINDINGS: Bones/Joint/Cartilage

Healing comminuted fracture of the olecranon status post plate and
screw fixation. Small portions of the fracture lines still remain
visible. No evidence of hardware failure or loosening. No
dislocation. Joint spaces are preserved. Small joint effusion.

Ligaments

Ligaments are suboptimally evaluated by CT.

Muscles and Tendons
Grossly intact.  No muscle atrophy.

Soft tissue
No fluid collection or hematoma.  No soft tissue mass.
IMPRESSION: 1. Healing comminuted fracture of the olecranon status post ORIF.
Small portions of the fracture lines still remain visible. No
hardware complication.
2. Small joint effusion.

## 2021-09-26 ENCOUNTER — Encounter: Payer: Self-pay | Admitting: Internal Medicine

## 2022-03-07 DIAGNOSIS — Z01419 Encounter for gynecological examination (general) (routine) without abnormal findings: Secondary | ICD-10-CM | POA: Diagnosis not present

## 2022-03-07 DIAGNOSIS — Z6821 Body mass index (BMI) 21.0-21.9, adult: Secondary | ICD-10-CM | POA: Diagnosis not present

## 2022-03-07 DIAGNOSIS — Z113 Encounter for screening for infections with a predominantly sexual mode of transmission: Secondary | ICD-10-CM | POA: Diagnosis not present

## 2022-05-07 DIAGNOSIS — M4727 Other spondylosis with radiculopathy, lumbosacral region: Secondary | ICD-10-CM | POA: Diagnosis not present

## 2022-05-07 DIAGNOSIS — M5387 Other specified dorsopathies, lumbosacral region: Secondary | ICD-10-CM | POA: Diagnosis not present

## 2022-05-07 DIAGNOSIS — M609 Myositis, unspecified: Secondary | ICD-10-CM | POA: Diagnosis not present

## 2022-05-07 DIAGNOSIS — M25559 Pain in unspecified hip: Secondary | ICD-10-CM | POA: Diagnosis not present

## 2022-05-08 DIAGNOSIS — M25559 Pain in unspecified hip: Secondary | ICD-10-CM | POA: Diagnosis not present

## 2022-05-08 DIAGNOSIS — M4727 Other spondylosis with radiculopathy, lumbosacral region: Secondary | ICD-10-CM | POA: Diagnosis not present

## 2022-05-08 DIAGNOSIS — M609 Myositis, unspecified: Secondary | ICD-10-CM | POA: Diagnosis not present

## 2022-05-08 DIAGNOSIS — M5387 Other specified dorsopathies, lumbosacral region: Secondary | ICD-10-CM | POA: Diagnosis not present

## 2022-05-13 DIAGNOSIS — M609 Myositis, unspecified: Secondary | ICD-10-CM | POA: Diagnosis not present

## 2022-05-13 DIAGNOSIS — M5387 Other specified dorsopathies, lumbosacral region: Secondary | ICD-10-CM | POA: Diagnosis not present

## 2022-05-13 DIAGNOSIS — M25559 Pain in unspecified hip: Secondary | ICD-10-CM | POA: Diagnosis not present

## 2022-05-13 DIAGNOSIS — M4727 Other spondylosis with radiculopathy, lumbosacral region: Secondary | ICD-10-CM | POA: Diagnosis not present

## 2022-05-13 DIAGNOSIS — Z30431 Encounter for routine checking of intrauterine contraceptive device: Secondary | ICD-10-CM | POA: Diagnosis not present

## 2022-05-15 DIAGNOSIS — M5387 Other specified dorsopathies, lumbosacral region: Secondary | ICD-10-CM | POA: Diagnosis not present

## 2022-05-15 DIAGNOSIS — M609 Myositis, unspecified: Secondary | ICD-10-CM | POA: Diagnosis not present

## 2022-05-15 DIAGNOSIS — M25559 Pain in unspecified hip: Secondary | ICD-10-CM | POA: Diagnosis not present

## 2022-05-15 DIAGNOSIS — M4727 Other spondylosis with radiculopathy, lumbosacral region: Secondary | ICD-10-CM | POA: Diagnosis not present

## 2022-11-26 ENCOUNTER — Encounter: Payer: Self-pay | Admitting: Family Medicine

## 2022-11-26 NOTE — Telephone Encounter (Signed)
Message left on voice mail.

## 2022-11-29 NOTE — Progress Notes (Deleted)
Start time: End time:  Virtual Visit via Video Note  I connected with Madison Bowen on 11/29/22 by a video enabled telemedicine application and verified that I am speaking with the correct person using two identifiers.  Location: Patient: *** Provider: office   I discussed the limitations of evaluation and management by telemedicine and the availability of in person appointments. The patient expressed understanding and agreed to proceed.  History of Present Illness:  No chief complaint on file.  Patient will be traveling to [place] with her family. She is requesting typhoid vaccination.  She hasn't been seen by me since her CPE in 07/2018.  Immunization History  Administered Date(s) Administered   DTaP 04/01/2000, 06/02/2000, 08/05/2000, 12/30/2000, 09/26/2004   H1N1 12/03/2007, 02/09/2008   HIB (PRP-OMP) 04/01/2000, 06/02/2000, 08/05/2000, 12/30/2000   HPV Quadrivalent 03/15/2011, 05/20/2011, 10/07/2011   Hepatitis A 10/18/2005, 04/21/2006   Hepatitis B 10/24/1999, 04/01/2000, 06/02/2000   IPV 04/01/2000, 06/02/2000, 08/05/2000, 09/26/2004   Influenza Split 12/11/2009, 11/23/2010, 11/09/2011   Influenza,inj,quad, With Preservative 10/10/2016, 11/21/2016, 10/16/2017   MMR 10/02/2000, 09/26/2004   Meningococcal B, OMV 06/08/2014, 07/15/2014   Meningococcal Conjugate 03/15/2011   Meningococcal Mcv4o 07/11/2016   PPD Test 07/10/2015   Pneumococcal Conjugate-13 08/05/2000, 12/30/2000, 09/25/2001, 03/15/2011   Tdap 03/15/2011   Varicella 10/02/2000, 12/01/2006      Observations/Objective:  There were no vitals taken for this visit.   Assessment and Plan:  Did she get Tdap elsewhere?? (She had called about it in 2023, knew she was due) If not, needs to come get (along with flu shot)  Ensure in [place] Where does she live now?  Does she need to schedule CPE or find new PCP??   Advised that she should complete the series >1 week prior to exposure.  Take 1 hour before meal  with cold or lukewarm drink.  Follow Up Instructions:    I discussed the assessment and treatment plan with the patient. The patient was provided an opportunity to ask questions and all were answered. The patient agreed with the plan and demonstrated an understanding of the instructions.   The patient was advised to call back or seek an in-person evaluation if the symptoms worsen or if the condition fails to improve as anticipated.  I spent *** minutes dedicated to the care of this patient, including pre-visit review of records, face to face time, post-visit ordering of testing and documentation.    Lavonda Jumbo, MD

## 2022-11-29 NOTE — Telephone Encounter (Signed)
I found in her chart where she had just the TD done on 05/18/21 at [facility] but she did not TDAP.

## 2022-12-02 ENCOUNTER — Telehealth: Payer: Self-pay | Admitting: Family Medicine

## 2022-12-02 DIAGNOSIS — Z7184 Encounter for health counseling related to travel: Secondary | ICD-10-CM
